# Patient Record
Sex: Male | Born: 1960 | Race: Black or African American | Hispanic: No | Marital: Married | Smoking: Current some day smoker
Health system: Southern US, Community
[De-identification: ages and names within clinical notes are randomized; demographics above are authoritative.]

---

## 1998-03-19 ENCOUNTER — Emergency Department (HOSPITAL_COMMUNITY): Admission: EM | Admit: 1998-03-19 | Discharge: 1998-03-19 | Payer: Self-pay | Admitting: Emergency Medicine

## 2008-01-23 ENCOUNTER — Emergency Department (HOSPITAL_COMMUNITY): Admission: EM | Admit: 2008-01-23 | Discharge: 2008-01-23 | Payer: Self-pay | Admitting: Emergency Medicine

## 2014-01-25 ENCOUNTER — Encounter (HOSPITAL_COMMUNITY): Payer: Self-pay | Admitting: Emergency Medicine

## 2014-01-25 ENCOUNTER — Emergency Department (HOSPITAL_COMMUNITY)
Admission: EM | Admit: 2014-01-25 | Discharge: 2014-01-25 | Disposition: A | Discharge status: Home / Self Care | Payer: Self-pay | Attending: Emergency Medicine | Admitting: Emergency Medicine

## 2014-01-25 ENCOUNTER — Emergency Department (INDEPENDENT_AMBULATORY_CARE_PROVIDER_SITE_OTHER)
Admission: EM | Admit: 2014-01-25 | Discharge: 2014-01-25 | Disposition: A | Discharge status: Nursing Home (Includes ICF) | Payer: Self-pay | Source: Home / Self Care | Attending: Emergency Medicine | Admitting: Emergency Medicine

## 2014-01-25 ENCOUNTER — Emergency Department (HOSPITAL_COMMUNITY): Payer: Self-pay

## 2014-01-25 ENCOUNTER — Emergency Department (INDEPENDENT_AMBULATORY_CARE_PROVIDER_SITE_OTHER): Payer: Self-pay

## 2014-01-25 DIAGNOSIS — I159 Secondary hypertension, unspecified: Secondary | ICD-10-CM

## 2014-01-25 DIAGNOSIS — N200 Calculus of kidney: Secondary | ICD-10-CM

## 2014-01-25 DIAGNOSIS — R1084 Generalized abdominal pain: Secondary | ICD-10-CM

## 2014-01-25 DIAGNOSIS — R319 Hematuria, unspecified: Secondary | ICD-10-CM

## 2014-01-25 DIAGNOSIS — R109 Unspecified abdominal pain: Secondary | ICD-10-CM | POA: Insufficient documentation

## 2014-01-25 DIAGNOSIS — F17200 Nicotine dependence, unspecified, uncomplicated: Secondary | ICD-10-CM | POA: Insufficient documentation

## 2014-01-25 DIAGNOSIS — K5900 Constipation, unspecified: Secondary | ICD-10-CM

## 2014-01-25 DIAGNOSIS — I158 Other secondary hypertension: Secondary | ICD-10-CM | POA: Insufficient documentation

## 2014-01-25 DIAGNOSIS — N19 Unspecified kidney failure: Secondary | ICD-10-CM

## 2014-01-25 DIAGNOSIS — K567 Ileus, unspecified: Secondary | ICD-10-CM

## 2014-01-25 DIAGNOSIS — R112 Nausea with vomiting, unspecified: Secondary | ICD-10-CM

## 2014-01-25 DIAGNOSIS — Z79899 Other long term (current) drug therapy: Secondary | ICD-10-CM | POA: Insufficient documentation

## 2014-01-25 DIAGNOSIS — I10 Essential (primary) hypertension: Secondary | ICD-10-CM

## 2014-01-25 LAB — CBC WITH DIFFERENTIAL/PLATELET
BASOS ABS: 0 10*3/uL (ref 0.0–0.1)
Basophils Relative: 0 % (ref 0–1)
Eosinophils Absolute: 0.2 10*3/uL (ref 0.0–0.7)
Eosinophils Relative: 2 % (ref 0–5)
HEMATOCRIT: 43.6 % (ref 39.0–52.0)
Hemoglobin: 15.2 g/dL (ref 13.0–17.0)
LYMPHS PCT: 24 % (ref 12–46)
Lymphs Abs: 1.8 10*3/uL (ref 0.7–4.0)
MCH: 29.9 pg (ref 26.0–34.0)
MCHC: 34.9 g/dL (ref 30.0–36.0)
MCV: 85.8 fL (ref 78.0–100.0)
MONO ABS: 0.6 10*3/uL (ref 0.1–1.0)
Monocytes Relative: 8 % (ref 3–12)
NEUTROS ABS: 4.7 10*3/uL (ref 1.7–7.7)
Neutrophils Relative %: 66 % (ref 43–77)
Platelets: 212 10*3/uL (ref 150–400)
RBC: 5.08 MIL/uL (ref 4.22–5.81)
RDW: 12.5 % (ref 11.5–15.5)
WBC: 7.2 10*3/uL (ref 4.0–10.5)

## 2014-01-25 LAB — URINALYSIS, ROUTINE W REFLEX MICROSCOPIC
Bilirubin Urine: NEGATIVE
GLUCOSE, UA: NEGATIVE mg/dL
Ketones, ur: 15 mg/dL — AB
NITRITE: NEGATIVE
PH: 6 (ref 5.0–8.0)
Protein, ur: NEGATIVE mg/dL
SPECIFIC GRAVITY, URINE: 1.014 (ref 1.005–1.030)
Urobilinogen, UA: 0.2 mg/dL (ref 0.0–1.0)

## 2014-01-25 LAB — POCT I-STAT, CHEM 8
BUN: 15 mg/dL (ref 6–23)
CHLORIDE: 102 meq/L (ref 96–112)
Calcium, Ion: 1.17 mmol/L (ref 1.12–1.23)
Creatinine, Ser: 2.1 mg/dL — ABNORMAL HIGH (ref 0.50–1.35)
Glucose, Bld: 109 mg/dL — ABNORMAL HIGH (ref 70–99)
HCT: 56 % — ABNORMAL HIGH (ref 39.0–52.0)
Hemoglobin: 19 g/dL — ABNORMAL HIGH (ref 13.0–17.0)
POTASSIUM: 3.9 meq/L (ref 3.7–5.3)
SODIUM: 137 meq/L (ref 137–147)
TCO2: 27 mmol/L (ref 0–100)

## 2014-01-25 LAB — POCT URINALYSIS DIP (DEVICE)
BILIRUBIN URINE: NEGATIVE
GLUCOSE, UA: NEGATIVE mg/dL
LEUKOCYTES UA: NEGATIVE
Nitrite: NEGATIVE
PH: 6.5 (ref 5.0–8.0)
Protein, ur: 30 mg/dL — AB
Specific Gravity, Urine: 1.02 (ref 1.005–1.030)
Urobilinogen, UA: 0.2 mg/dL (ref 0.0–1.0)

## 2014-01-25 LAB — URINE MICROSCOPIC-ADD ON

## 2014-01-25 LAB — OCCULT BLOOD, POC DEVICE: FECAL OCCULT BLD: NEGATIVE

## 2014-01-25 MED ORDER — IOHEXOL 300 MG/ML  SOLN
50.0000 mL | Freq: Once | INTRAMUSCULAR | Status: AC | PRN
  Administered 2014-01-25: 50 mL via ORAL

## 2014-01-25 MED ORDER — HYDROCODONE-ACETAMINOPHEN 5-325 MG PO TABS: 1.0000 | ORAL_TABLET | ORAL | Status: AC | PRN

## 2014-01-25 MED ORDER — SODIUM CHLORIDE 0.9 % IV BOLUS (SEPSIS): 1000.0000 mL | Freq: Once | INTRAVENOUS | Status: DC

## 2014-01-25 MED ORDER — ONDANSETRON HCL 4 MG/2ML IJ SOLN: 4.0000 mg | Freq: Once | INTRAMUSCULAR | Status: DC

## 2014-01-25 NOTE — ED Notes (Signed)
C/o  Severe abdominal cramping on Tuesday after eating some chicken.   Pt states that he has a fullness/bloat.   Nausea.  And unable to have a BM.  Last normal BM was early Tuesday morning.  No relief with otc laxatives .

## 2014-01-25 NOTE — Discharge Instructions (Signed)
Kidney Stones °Kidney stones (urolithiasis) are deposits that form inside your kidneys. The intense pain is caused by the stone moving through the urinary tract. When the stone moves, the ureter goes into spasm around the stone. The stone is usually passed in the urine.  °CAUSES  °· A disorder that makes certain neck glands produce too much parathyroid hormone (primary hyperparathyroidism). °· A buildup of uric acid crystals, similar to gout in your joints. °· Narrowing (stricture) of the ureter. °· A kidney obstruction present at birth (congenital obstruction). °· Previous surgery on the kidney or ureters. °· Numerous kidney infections. °SYMPTOMS  °· Feeling sick to your stomach (nauseous). °· Throwing up (vomiting). °· Blood in the urine (hematuria). °· Pain that usually spreads (radiates) to the groin. °· Frequency or urgency of urination. °DIAGNOSIS  °· Taking a history and physical exam. °· Blood or urine tests. °· CT scan. °· Occasionally, an examination of the inside of the urinary bladder (cystoscopy) is performed. °TREATMENT  °· Observation. °· Increasing your fluid intake. °· Extracorporeal shock wave lithotripsy--This is a noninvasive procedure that uses shock waves to break up kidney stones. °· Surgery may be needed if you have severe pain or persistent obstruction. There are various surgical procedures. Most of the procedures are performed with the use of small instruments. Only small incisions are needed to accommodate these instruments, so recovery time is minimized. °The size, location, and chemical composition are all important variables that will determine the proper choice of action for you. Talk to your health care provider to better understand your situation so that you will minimize the risk of injury to yourself and your kidney.  °HOME CARE INSTRUCTIONS  °· Drink enough water and fluids to keep your urine clear or pale yellow. This will help you to pass the stone or stone fragments. °· Strain  all urine through the provided strainer. Keep all particulate matter and stones for your health care provider to see. The stone causing the pain may be as small as a grain of salt. It is very important to use the strainer each and every time you pass your urine. The collection of your stone will allow your health care provider to analyze it and verify that a stone has actually passed. The stone analysis will often identify what you can do to reduce the incidence of recurrences. °· Only take over-the-counter or prescription medicines for pain, discomfort, or fever as directed by your health care provider. °· Make a follow-up appointment with your health care provider as directed. °· Get follow-up X-rays if required. The absence of pain does not always mean that the stone has passed. It may have only stopped moving. If the urine remains completely obstructed, it can cause loss of kidney function or even complete destruction of the kidney. It is your responsibility to make sure X-rays and follow-ups are completed. Ultrasounds of the kidney can show blockages and the status of the kidney. Ultrasounds are not associated with any radiation and can be performed easily in a matter of minutes. °SEEK MEDICAL CARE IF: °· You experience pain that is progressive and unresponsive to any pain medicine you have been prescribed. °SEEK IMMEDIATE MEDICAL CARE IF:  °· Pain cannot be controlled with the prescribed medicine. °· You have a fever or shaking chills. °· The severity or intensity of pain increases over 18 hours and is not relieved by pain medicine. °· You develop a new onset of abdominal pain. °· You feel faint or pass out. °·   You are unable to urinate. MAKE SURE YOU:   Understand these instructions.  Will watch your condition.  Will get help right away if you are not doing well or get worse. Document Released: 05/25/2005 Document Revised: 01/25/2013 Document Reviewed: 10/26/2012 [facility] Patient Information 2015  [employer] [place]. This information is not intended to replace advice given to you by your health care provider. Make sure you discuss any questions you have with your health care provider.  Hypertension Hypertension, commonly called high blood pressure, is when the force of blood pumping through your arteries is too strong. Your arteries are the blood vessels that carry blood from your heart throughout your body. A blood pressure reading consists of a higher number over a lower number, such as 110/72. The higher number (systolic) is the pressure inside your arteries when your heart pumps. The lower number (diastolic) is the pressure inside your arteries when your heart relaxes. Ideally you want your blood pressure below 120/80. Hypertension forces your heart to work harder to pump blood. Your arteries may become narrow or stiff. Having hypertension puts you at risk for heart disease, stroke, and other problems.  RISK FACTORS Some risk factors for high blood pressure are controllable. Others are not.  Risk factors you cannot control include:   Race. You may be at higher risk if you are African American.  Age. Risk increases with age.  Gender. Men are at higher risk than women before age 55 years. After age 55, women are at higher risk than men. Risk factors you can control include:  Not getting enough exercise or physical activity.  Being overweight.  Getting too much fat, sugar, calories, or salt in your diet.  Drinking too much alcohol. SIGNS AND SYMPTOMS Hypertension does not usually cause signs or symptoms. Extremely high blood pressure (hypertensive crisis) may cause headache, anxiety, shortness of breath, and nosebleed. DIAGNOSIS  To check if you have hypertension, your health care provider will measure your blood pressure while you are seated, with your arm held at the level of your heart. It should be measured at least twice using the same arm. Certain conditions can cause a difference  in blood pressure between your right and left arms. A blood pressure reading that is higher than normal on one occasion does not mean that you need treatment. If one blood pressure reading is high, ask your health care provider about having it checked again. TREATMENT  Treating high blood pressure includes making lifestyle changes and possibly taking medicine. Living a healthy lifestyle can help lower high blood pressure. You may need to change some of your habits. Lifestyle changes may include:  Following the DASH diet. This diet is high in fruits, vegetables, and whole grains. It is low in salt, red meat, and added sugars.  Getting at least 2 hours of brisk physical activity every week.  Losing weight if necessary.  Not smoking.  Limiting alcoholic beverages.  Learning ways to reduce stress. If lifestyle changes are not enough to get your blood pressure under control, your health care provider may prescribe medicine. You may need to take more than one. Work closely with your health care provider to understand the risks and benefits. HOME CARE INSTRUCTIONS  Have your blood pressure rechecked as directed by your health care provider.   Take medicines only as directed by your health care provider. Follow the directions carefully. Blood pressure medicines must be taken as prescribed. The medicine does not work as well when you skip doses. Skipping  doses also puts you at risk for problems.   Do not smoke.   Monitor your blood pressure at home as directed by your health care provider. SEEK MEDICAL CARE IF:   You think you are having a reaction to medicines taken.  You have recurrent headaches or feel dizzy.  You have swelling in your ankles.  You have trouble with your vision. SEEK IMMEDIATE MEDICAL CARE IF:  You develop a severe headache or confusion.  You have unusual weakness, numbness, or feel faint.  You have severe chest or abdominal pain.  You vomit  repeatedly.  You have trouble breathing. MAKE SURE YOU:   Understand these instructions.  Will watch your condition.  Will get help right away if you are not doing well or get worse. Document Released: 05/25/2005 Document Revised: 10/09/2013 Document Reviewed: 03/17/2013 [facility] Patient Information 2015 [place], [place]. This information is not intended to replace advice given to you by your health care provider. Make sure you discuss any questions you have with your health care provider.

## 2014-01-25 NOTE — ED Notes (Addendum)
Dr.Floyd aware of pt's refusing IV

## 2014-01-25 NOTE — ED Provider Notes (Signed)
Chief Complaint   Chief Complaint  Patient presents with  . 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 The pain seemed to begin in the bilateral CVA area and radiated around to the anterior abdomen, covering the entire abdomen and it was rated 5-6/10 at the worst and now is down to 1/10. The patient has not had a bowel movement since this first began. He states he's had no appetite and been unable to eat. He's had difficulty urinating and decreased urine output. The pain was worse after taking a laxative and better if he passes flatus. He denies fever, chills, dysuria, frequency, urgency, or hematuria. He has felt nauseated and vomited once last night. There was no blood in the vomitus or bilious emesis. He denies any history of ulcer disease, gastritis, gallstones, liver problems, kidney stones or kidney infections, diverticulitis or colitis.  Review of Systems   Other than as noted above, the patient denies any of the following symptoms: Constitutional:  No fever, chills, weight loss or anorexia. Abdomen:  No nausea, vomiting, hematememesis, melena, diarrhea, or hematochezia. GU:  No dysuria, frequency, urgency, or hematuria.  No testicular pain or swelling.  PMFSH   Past medical history, family history, social history, meds, and allergies were reviewed. He takes no medications and has not seen a physician in years.  Physical Examination     Vital signs:  BP 188/90  Pulse 74  Temp(Src) 99 F (37.2 C) (Oral)  Resp 16  SpO2 100% Gen:  Alert, oriented, in no distress. Lungs:  Breath sounds clear and equal bilaterally.  No wheezes, rales or rhonchi. Heart:  Regular rhythm.  No gallops or murmers.   Abdomen:  Moderately distended, somewhat tight to palpation, but no tenderness, guarding,  rebound. Bowel sounds are decreased but present, no pulsatile midline abdominal mass or bruit. No organomegaly or mass. He has no hernia, testes are normal. Rectal exam:  No masses, prostate is not enlarged and there no nodules. Prostate is moderately tender to palpation. Stool is heme negative.  Skin:  Clear, warm and dry.  No rash.  Labs   Results for orders placed during the hospital encounter of 01/25/14  CBC WITH DIFFERENTIAL      Result Value Ref Range   WBC 7.2  4.0 - 10.5 K/uL   RBC 5.08  4.22 - 5.81 MIL/uL   Hemoglobin 15.2  13.0 - 17.0 g/dL   HCT 16.143.6  09.639.0 - 04.552.0 %   MCV 85.8  78.0 - 100.0 fL   MCH 29.9  26.0 - 34.0 pg   MCHC 34.9  30.0 - 36.0 g/dL   RDW 40.912.5  81.111.5 - 91.415.5 %   Platelets 212  150 - 400 K/uL   Neutrophils Relative % 66  43 - 77 %   Neutro Abs 4.7  1.7 - 7.7 K/uL   Lymphocytes Relative 24  12 - 46 %   Lymphs Abs 1.8  0.7 - 4.0 K/uL   Monocytes Relative 8  3 - 12 %   Monocytes Absolute 0.6  0.1 - 1.0 K/uL   Eosinophils Relative 2  0 - 5 %   Eosinophils Absolute 0.2  0.0 - 0.7 K/uL   Basophils Relative 0  0 - 1 %   Basophils Absolute 0.0  0.0 -  0.1 K/uL  OCCULT BLOOD, POC DEVICE      Result Value Ref Range   Fecal Occult Bld NEGATIVE  NEGATIVE  POCT URINALYSIS DIP (DEVICE)      Result Value Ref Range   Glucose, UA NEGATIVE  NEGATIVE mg/dL   Bilirubin Urine NEGATIVE  NEGATIVE   Ketones, ur TRACE (*) NEGATIVE mg/dL   Specific Gravity, Urine 1.020  1.005 - 1.030   Hgb urine dipstick LARGE (*) NEGATIVE   pH 6.5  5.0 - 8.0   Protein, ur 30 (*) NEGATIVE mg/dL   Urobilinogen, UA 0.2  0.0 - 1.0 mg/dL   Nitrite NEGATIVE  NEGATIVE   Leukocytes, UA NEGATIVE  NEGATIVE  POCT I-STAT, CHEM 8      Result Value Ref Range   Sodium 137  137 - 147 mEq/L   Potassium 3.9  3.7 - 5.3 mEq/L   Chloride 102  96 - 112 mEq/L   BUN 15  6 - 23 mg/dL   Creatinine, Ser 1.61 (*) 0.50 - 1.35 mg/dL   Glucose, Bld 096 (*) 70 - 99 mg/dL   Calcium, Ion 0.45  4.09 - 1.23 mmol/L    TCO2 27  0 - 100 mmol/L   Hemoglobin 19.0 (*) 13.0 - 17.0 g/dL   HCT 81.1 (*) 91.4 - 78.2 %     Radiology   Dg Abd Acute W/chest  01/25/2014   CLINICAL DATA:  Two-day history of abdominal discomfort ; history of tobacco use  EXAM: ACUTE ABDOMEN SERIES (ABDOMEN 2 VIEW & CHEST 1 VIEW)  COMPARISON:  None.  FINDINGS: The lungs are adequately inflated. The interstitial markings are coarse inferiorly, bilaterally. The cardiopericardial silhouette is top-normal in size. The pulmonary vascularity is not engorged. The mediastinum is normal in width. There is no pleural effusion or pneumothorax. The bony thorax exhibits no acute abnormality.  Within the abdomen there is a moderate amount of gas throughout the ascending and transverse portions of the colon with smaller amounts in the small bowel. There is no rectal gas. No free extraluminal gas collections are demonstrated. There are small bowel air-fluid levels on the upright film. There are soft tissue calcifications or radiodensities that may be related to previous barium administration. The bony structures are unremarkable.  IMPRESSION: 1. The bowel gas pattern suggests an ileus or gastroenteritis type process. If the patient's symptoms warrant further imaging now, abdominal pelvic CT scanning may be useful. 2. Coarse interstitial lung markings bilaterally are consistent with the patient's smoking history with possible superimposed subsegmental atelectasis or fibrosis.   Electronically Signed   By: Tabria Steines  [place]   On: 01/25/2014 13:56   Assessment   The primary encounter diagnosis was Generalized abdominal pain. Diagnoses of Constipation, unspecified constipation type, Ileus, Essential hypertension, Renal failure, Non-intractable vomiting with nausea, vomiting of unspecified type, and Hematuria were also pertinent to this visit.  My concern is for abdominal aortic aneurysm, ileus, partial small bowel obstruction, pancreatitis, diverticulitis, kidney stone or  infection, prostatitis, acute or chronic kidney failure, or gastroenteritis. I feel he needs further workup to include a CT of the abdomen or ultrasound. He'll also need followup with a primary care physician for his hypertension and increased creatinine. I have given him the name of Dr. Rocco Pauls Bonzu who is his wife's doctor.  Plan     The patient was transferred to the ED via shuttle in stable condition.  Medical Decision Making:  The patient is a 53 year old male with a 3 day history  of generalized abdominal pain that radiates from his back, around his flanks and to his anterior abdomen.  He also has constipation, difficulty urinating, nausea and vomiting. On exam his abdomen is distended and bowel sounds are diminished.  His prostate is moderately tender on rectal exam with heme negative stools.  His acute abdominal series shows ileus.  Creatinine is 2.1 (no baseline value).  CBC is normal.  UA shows large occult blood.  My concerns are for: abdominal aortic aneurism, ileus, partial small bowel obstruction, kidney stones, or acute renal failure.  I feel he needs further evaluation, possibly a CT or ultrasound.       Reuben Likes, MD 01/25/14 [phone]

## 2014-01-25 NOTE — Discharge Instructions (Signed)
We have determined that your problem requires further evaluation in the emergency department.  We will take care of your transport there.  Once at the emergency department, you will be evaluated by a provider and they will order whatever treatment or tests they deem necessary.  We cannot guarantee that they will do any specific test or do any specific treatment.  ° °

## 2014-01-25 NOTE — ED Notes (Addendum)
[facility] c/o lower abdominal pain, n/v, last BM was last week. Sent here for CT, concern for ileus. Needs CT. Pt is a x 4. [facility] No active [facility].

## 2014-01-25 NOTE — ED Notes (Signed)
Dr. Floyd at bedside. 

## 2014-01-25 NOTE — ED Notes (Signed)
Pt refusing IV; sts "I really do not want more blood taken from me." Informed pt of IV fluids and medications. Pt refusing

## 2014-01-25 NOTE — ED Notes (Signed)
Pt fully dressed and ready to be discharge. Informed MD of patient status

## 2014-01-25 NOTE — ED Provider Notes (Signed)
CSN: [id number]     Arrival date & time 01/25/14  1514 History   First MD Initiated Contact with Patient 01/25/14 1650     Chief Complaint  Patient presents with  . Abdominal Pain     (Consider location/radiation/quality/duration/timing/severity/associated sxs/prior Treatment) Patient is a 53 y.o. male presenting with flank pain. The history is provided by the patient.  Flank Pain This is a new problem. The current episode started in the past 7 days. The problem occurs constantly. The problem has been unchanged. Pertinent negatives include no abdominal pain, arthralgias, chest pain, chills, congestion, fever, headaches, myalgias, rash or vomiting. Nothing aggravates the symptoms. He has tried nothing for the symptoms. The treatment provided no relief.    53 yo M with a chief complaint of abdominal pain. Patient states it was in bilateral flanks it was never very severe. Patient states it has been somewhat diffuse as well. Patient with some nausea and vomiting with this denies bilious or bloody emesis. Patient denies fevers or chills. Patient has been passing gas frequently today. Patient has been constipated for the past couple days.  No history of abdominal surgery. Patient does not see a doctor regularly. Patient does not know if he has any kidney problems. History reviewed. No pertinent past medical history. History reviewed. No pertinent past surgical history. No family history on file. History  Substance Use Topics  . Smoking status: Current Some Day Smoker -- 0.50 packs/day    Types: Cigarettes  . Smokeless tobacco: Not on file  . Alcohol Use: Yes    Review of Systems  Constitutional: Negative for fever and chills.  HENT: Negative for congestion and facial swelling.   Eyes: Negative for discharge and visual disturbance.  Respiratory: Negative for shortness of breath.   Cardiovascular: Negative for chest pain and palpitations.  Gastrointestinal: Negative for vomiting,  abdominal pain and diarrhea.  Genitourinary: Positive for flank pain.  Musculoskeletal: Negative for arthralgias and myalgias.  Skin: Negative for color change and rash.  Neurological: Negative for tremors, syncope and headaches.  Psychiatric/Behavioral: Negative for confusion and dysphoric mood.      Allergies  Review of patient's allergies indicates no known allergies.  Home Medications   Prior to Admission medications   Medication Sig Start Date End Date Taking? Authorizing Provider  HYDROcodone-acetaminophen (NORCO/VICODIN) 5-325 MG per tablet Take 1 tablet by mouth every 4 (four) hours as needed for moderate pain or severe pain. 01/25/14   Melene Plan, MD   BP 160/95  Pulse 66  Temp(Src) 98.8 F (37.1 C) (Oral)  Resp 18  Ht 6' (1.829 m)  Wt 247 lb (112.038 kg)  BMI 33.49 kg/m2  SpO2 96% Physical Exam  Constitutional: He is oriented to person, place, and time. He appears well-developed and well-nourished.  HENT:  Head: Normocephalic and atraumatic.  Eyes: EOM are normal. Pupils are equal, round, and reactive to light.  Neck: Normal range of motion. Neck supple. No JVD present.  Cardiovascular: Normal rate and regular rhythm.  Exam reveals no gallop and no friction rub.   No murmur heard. Pulmonary/Chest: No respiratory distress. He has no wheezes.  Abdominal: He exhibits no distension. There is no rebound and no guarding.  Musculoskeletal: Normal range of motion.  Neurological: He is alert and oriented to person, place, and time.  Skin: No rash noted. No pallor.  Psychiatric: He has a normal mood and affect. His behavior is normal.    ED Course  Procedures (including critical care time) Labs Review Labs  Reviewed  URINALYSIS, ROUTINE W REFLEX MICROSCOPIC - Abnormal; Notable for the following:    APPearance HAZY (*)    Hgb urine dipstick LARGE (*)    Ketones, ur 15 (*)    Leukocytes, UA TRACE (*)    All other components within normal limits  URINE MICROSCOPIC-ADD  ON  COMPREHENSIVE METABOLIC PANEL  LIPASE, BLOOD    Imaging Review Ct Abdomen Pelvis Wo Contrast  01/25/2014   CLINICAL DATA:  Abdominal pain.  EXAM: CT ABDOMEN AND PELVIS WITHOUT CONTRAST  TECHNIQUE: Multidetector CT imaging of the abdomen and pelvis was performed following the standard protocol without IV contrast.  COMPARISON:  None.  FINDINGS: Lung bases are clear. There is no evidence for free intraperitoneal air.  No gross abnormality to the liver, gallbladder, spleen or pancreas on this noncontrast examination. Normal appearance of the adrenal glands. Normal appearance of the stomach and duodenum.  There are 2 stones in the right kidney upper pole and the largest measures 4 mm. There is no evidence for right hydronephrosis. There is a 4 mm stone along the posterior aspect of the bladder but near the left ureterovesical junction. There is mild edema around the left renal pelvis and the left ureter. Mild dilatation of the left ureter. There is mild left hydronephrosis. Question a punctate stone in the left kidney mid pole. Urinary bladder is markedly distended.  No significant free fluid or lymphadenopathy. Normal appearance of the prostate with a few calcifications. There is contrast in the colon and appendix. No evidence for acute bowel inflammation. Normal appearance of the small bowel.  No acute bone abnormality.  IMPRESSION: Mild left hydroureteronephrosis with edema surrounding the left ureter. There is a 4 mm stone in the posterior bladder. This could represent a stone at the left ureterovesical junction or within the bladder lumen.  Right renal calculi.  Question a punctate left kidney stone.  Urinary bladder is distended.   Electronically Signed   By: Richarda OverlieAdam  Henn M.D.   On: 01/25/2014 20:33   Dg Abd Acute W/chest  01/25/2014   CLINICAL DATA:  Two-day history of abdominal discomfort ; history of tobacco use  EXAM: ACUTE ABDOMEN SERIES (ABDOMEN 2 VIEW & CHEST 1 VIEW)  COMPARISON:  None.   FINDINGS: The lungs are adequately inflated. The interstitial markings are coarse inferiorly, bilaterally. The cardiopericardial silhouette is top-normal in size. The pulmonary vascularity is not engorged. The mediastinum is normal in width. There is no pleural effusion or pneumothorax. The bony thorax exhibits no acute abnormality.  Within the abdomen there is a moderate amount of gas throughout the ascending and transverse portions of the colon with smaller amounts in the small bowel. There is no rectal gas. No free extraluminal gas collections are demonstrated. There are small bowel air-fluid levels on the upright film. There are soft tissue calcifications or radiodensities that may be related to previous barium administration. The bony structures are unremarkable.  IMPRESSION: 1. The bowel gas pattern suggests an ileus or gastroenteritis type process. If the patient's symptoms warrant further imaging now, abdominal pelvic CT scanning may be useful. 2. Coarse interstitial lung markings bilaterally are consistent with the patient's smoking history with possible superimposed subsegmental atelectasis or fibrosis.   Electronically Signed   By: David  [place]   On: 01/25/2014 13:56     EKG Interpretation None      MDM   Final diagnoses:  Nephrolithiasis  Secondary hypertension, unspecified    53 yo M with the chief complaint of diffuse  abdominal pain. Abdomen tympanitic and distended. Will obtain noncontrast CT scan secondary to possible chronic kidney disease.  Patient found to have bilateral kidney stones on CT scan. Left kidney stone 4 mm in the distal ureter with associated hydronephrosis and hydroureter. Patient without UTI on UA. Possible acute kidney injury based on labs from urgent care. Patient declines repeat laboratory results.  Discussed concern with patient possible acute kidney injury associated with bilateral nephrolithiasis. Patient states that he understands the concern but would  like to be discharged home. We'll discharge the patient home he'll follow up with urology and the [facility].  9:09 PM:  I have discussed the diagnosis/risks/treatment options with the patient and family and believe the pt to be eligible for discharge home to follow-up with PCP, urology. We also discussed returning to the ED immediately if new or worsening sx occur. We discussed the sx which are most concerning (e.g., dysguesia, weakness, fever, chills) that necessitate immediate return. Medications administered to the patient during their visit and any new prescriptions provided to the patient are listed below.  Medications given during this visit Medications  sodium chloride 0.9 % bolus 1,000 mL (1,000 mLs Intravenous Not Given 01/25/14 1723)  ondansetron (ZOFRAN) injection 4 mg (4 mg Intravenous Not Given 01/25/14 1723)  iohexol (OMNIPAQUE) 300 MG/ML solution 50 mL (50 mLs Oral Contrast Given 01/25/14 1714)    New Prescriptions   HYDROCODONE-ACETAMINOPHEN (NORCO/VICODIN) 5-325 MG PER TABLET    Take 1 tablet by mouth every 4 (four) hours as needed for moderate pain or severe pain.     Melene Plan, MD 01/25/14 2109

## 2014-01-25 NOTE — ED Provider Notes (Signed)
I saw and evaluated the patient, reviewed the resident's note and I agree with the findings and plan.   EKG Interpretation None     Ct Abdomen Pelvis Wo Contrast  01/25/2014   CLINICAL DATA:  Abdominal pain.  EXAM: CT ABDOMEN AND PELVIS WITHOUT CONTRAST  TECHNIQUE: Multidetector CT imaging of the abdomen and pelvis was performed following the standard protocol without IV contrast.  COMPARISON:  None.  FINDINGS: Lung bases are clear. There is no evidence for free intraperitoneal air.  No gross abnormality to the liver, gallbladder, spleen or pancreas on this noncontrast examination. Normal appearance of the adrenal glands. Normal appearance of the stomach and duodenum.  There are 2 stones in the right kidney upper pole and the largest measures 4 mm. There is no evidence for right hydronephrosis. There is a 4 mm stone along the posterior aspect of the bladder but near the left ureterovesical junction. There is mild edema around the left renal pelvis and the left ureter. Mild dilatation of the left ureter. There is mild left hydronephrosis. Question a punctate stone in the left kidney mid pole. Urinary bladder is markedly distended.  No significant free fluid or lymphadenopathy. Normal appearance of the prostate with a few calcifications. There is contrast in the colon and appendix. No evidence for acute bowel inflammation. Normal appearance of the small bowel.  No acute bone abnormality.  IMPRESSION: Mild left hydroureteronephrosis with edema surrounding the left ureter. There is a 4 mm stone in the posterior bladder. This could represent a stone at the left ureterovesical junction or within the bladder lumen.  Right renal calculi.  Question a punctate left kidney stone.  Urinary bladder is distended.   Electronically Signed   By: Richarda OverlieAdam  Henn M.D.   On: 01/25/2014 20:33   Dg Abd Acute W/chest  01/25/2014   CLINICAL DATA:  Two-day history of abdominal discomfort ; history of tobacco use  EXAM: ACUTE ABDOMEN  SERIES (ABDOMEN 2 VIEW & CHEST 1 VIEW)  COMPARISON:  None.  FINDINGS: The lungs are adequately inflated. The interstitial markings are coarse inferiorly, bilaterally. The cardiopericardial silhouette is top-normal in size. The pulmonary vascularity is not engorged. The mediastinum is normal in width. There is no pleural effusion or pneumothorax. The bony thorax exhibits no acute abnormality.  Within the abdomen there is a moderate amount of gas throughout the ascending and transverse portions of the colon with smaller amounts in the small bowel. There is no rectal gas. No free extraluminal gas collections are demonstrated. There are small bowel air-fluid levels on the upright film. There are soft tissue calcifications or radiodensities that may be related to previous barium administration. The bony structures are unremarkable.  IMPRESSION: 1. The bowel gas pattern suggests an ileus or gastroenteritis type process. If the patient's symptoms warrant further imaging now, abdominal pelvic CT scanning may be useful. 2. Coarse interstitial lung markings bilaterally are consistent with the patient's smoking history with possible superimposed subsegmental atelectasis or fibrosis.   Electronically Signed   By: David  [place]   On: 01/25/2014 13:56   Patient with what appears to be a left distal ureteral stone with mild left hydro-.  His creatinine is 2 and no prior baseline is obtainable at this time.  Patient would prefer to go home and followup with his primary care physician.  Home with a short course of pain medicine and urology followup.  He understands to return to the ER for new or worsening symptoms.  Results for orders placed  during the hospital encounter of 01/25/14  URINALYSIS, ROUTINE W REFLEX MICROSCOPIC      Result Value Ref Range   Color, Urine YELLOW  YELLOW   APPearance HAZY (*) CLEAR   Specific Gravity, Urine 1.014  1.005 - 1.030   pH 6.0  5.0 - 8.0   Glucose, UA NEGATIVE  NEGATIVE mg/dL   Hgb  urine dipstick LARGE (*) NEGATIVE   Bilirubin Urine NEGATIVE  NEGATIVE   Ketones, ur 15 (*) NEGATIVE mg/dL   Protein, ur NEGATIVE  NEGATIVE mg/dL   Urobilinogen, UA 0.2  0.0 - 1.0 mg/dL   Nitrite NEGATIVE  NEGATIVE   Leukocytes, UA TRACE (*) NEGATIVE  URINE MICROSCOPIC-ADD ON      Result Value Ref Range   Squamous Epithelial / LPF RARE  RARE   WBC, UA 3-6  <3 WBC/hpf   RBC / HPF 11-20  <3 RBC/hpf   Bacteria, UA RARE  RARE   Urine-Other MUCOUS PRESENT       Lyanne Co, MD 01/26/14 0003

## 2015-05-30 IMAGING — CR DG ABDOMEN ACUTE W/ 1V CHEST
3 series · 3 of 3 positions shown · non-contrast
Comparison: None.

CLINICAL DATA: Two-day history of abdominal discomfort ; history of
tobacco use

EXAM:
ACUTE ABDOMEN SERIES (ABDOMEN 2 VIEW & CHEST 1 VIEW)

[view not recorded (1 of 3)]
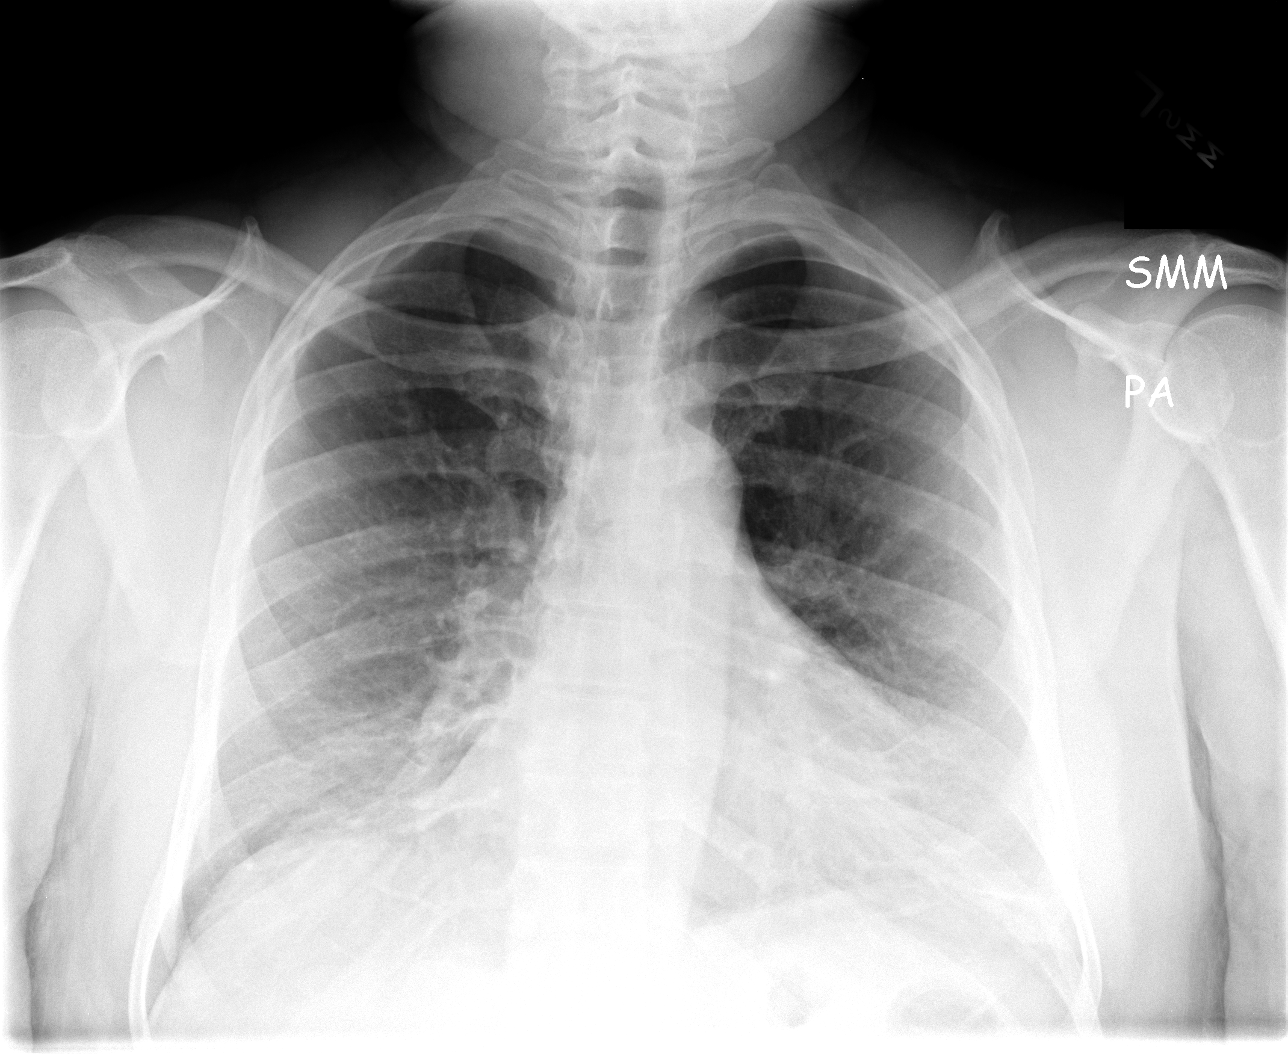

[view not recorded (2 of 3)]
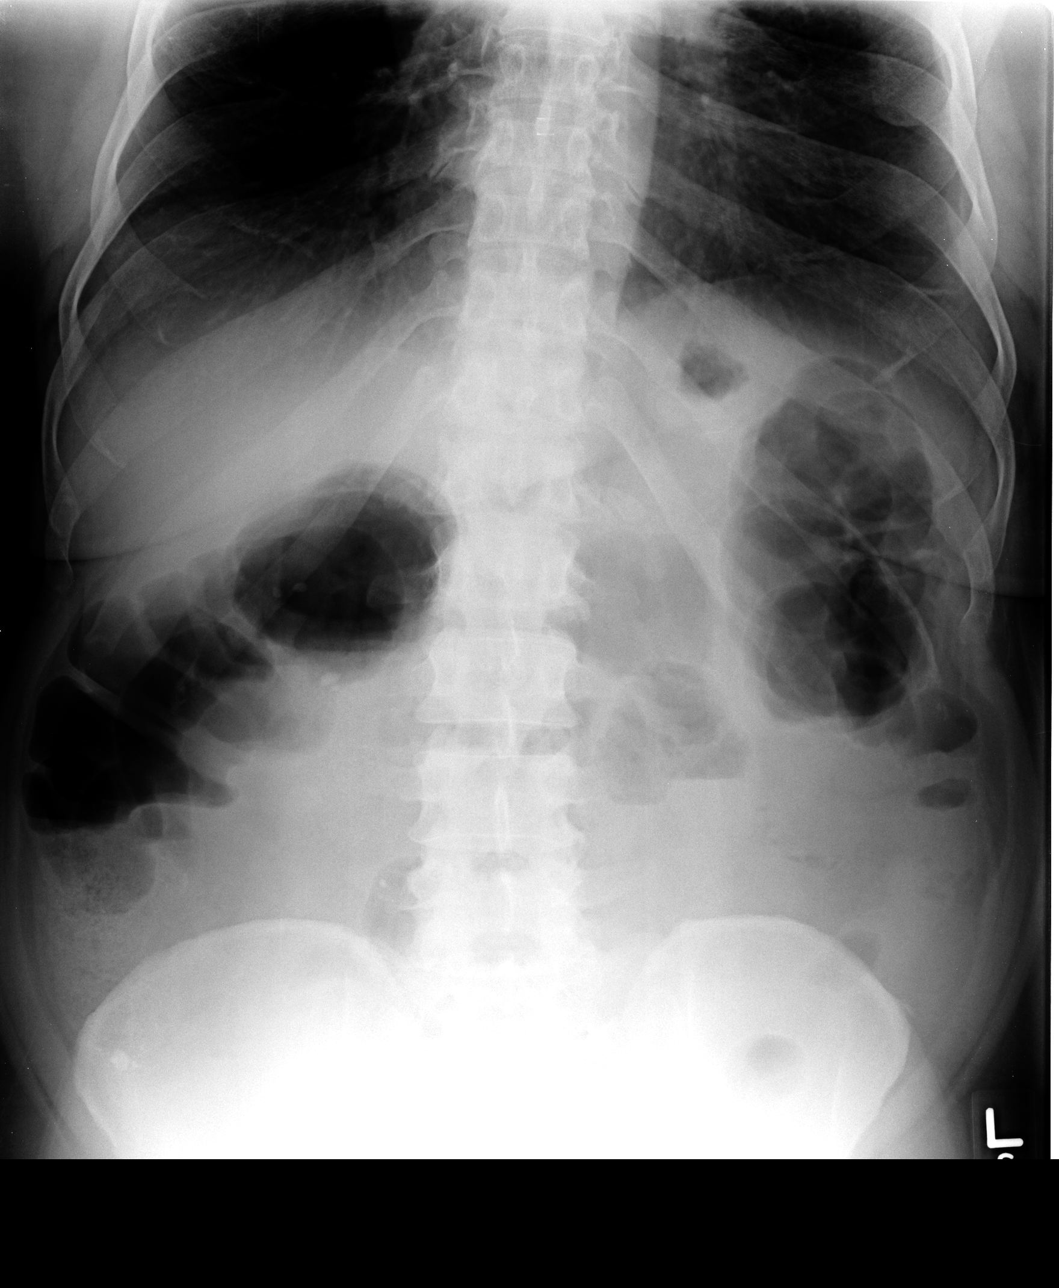

[view not recorded (3 of 3)]
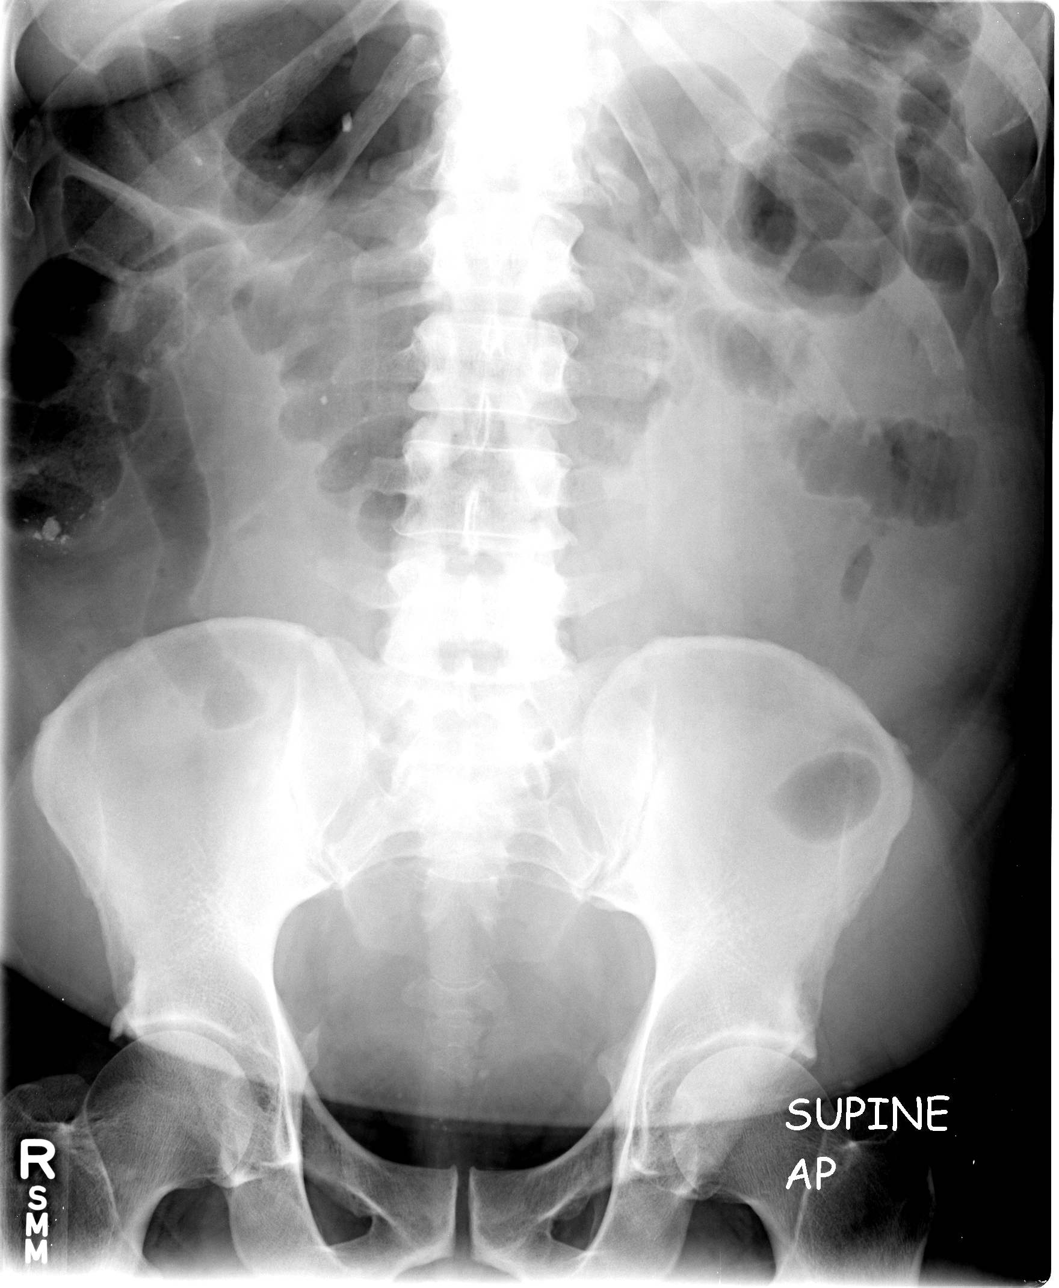

[3 of 3 positions shown; findings below may reference images not displayed]

FINDINGS: The lungs are adequately inflated. The interstitial markings are
coarse inferiorly, bilaterally. The cardiopericardial silhouette is
top-normal in size. The pulmonary vascularity is not engorged. The
mediastinum is normal in width. There is no pleural effusion or
pneumothorax. The bony thorax exhibits no acute abnormality.

Within the abdomen there is a moderate amount of gas throughout the
ascending and transverse portions of the colon with smaller amounts
in the small bowel. There is no rectal gas. No free extraluminal gas
collections are demonstrated. There are small bowel air-fluid levels
on the upright film. There are soft tissue calcifications or
radiodensities that may be related to previous barium
administration. The bony structures are unremarkable.
IMPRESSION: 1. The bowel gas pattern suggests an ileus or gastroenteritis type
process. If the patient's symptoms warrant further imaging now,
abdominal pelvic CT scanning may be useful.
2. Coarse interstitial lung markings bilaterally are consistent with
the patient's smoking history with possible superimposed
subsegmental atelectasis or fibrosis.

## 2015-05-30 IMAGING — CT CT ABD-PELV W/O CM
2 of 4 series · 16 of 46 positions shown, 18 images · non-contrast
Comparison: None.

CLINICAL DATA: Abdominal pain.

EXAM:
CT ABDOMEN AND PELVIS WITHOUT CONTRAST
TECHNIQUE: Multidetector CT imaging of the abdomen and pelvis was performed
following the standard protocol without IV contrast.

[Series 2: abd/ pelvis 5.0 i30f 1 · axial · 0.71mm/px · z∈[+879,+1329]mm · 13 of 98 slices shown, 15 images]
[im 4/98  soft-tissue]
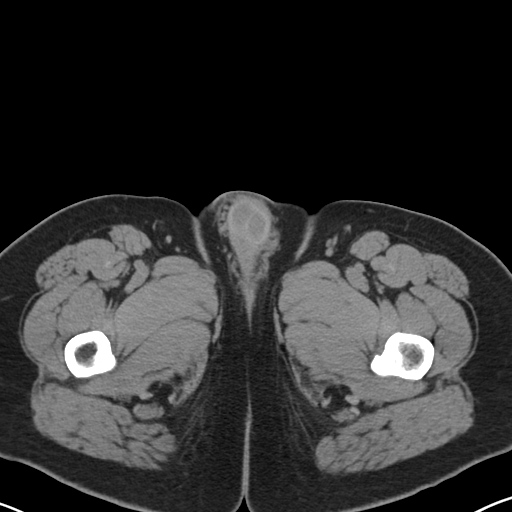
[im 4/98  bone]
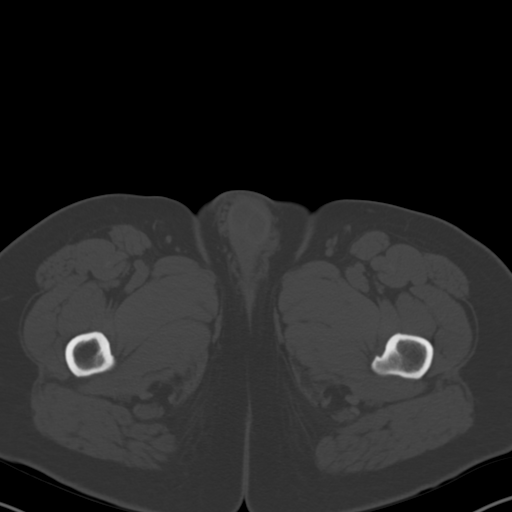
[im 12/98  soft-tissue]
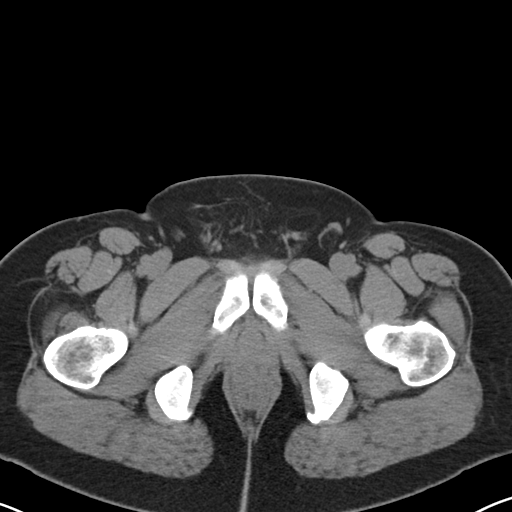
[im 20/98  soft-tissue]
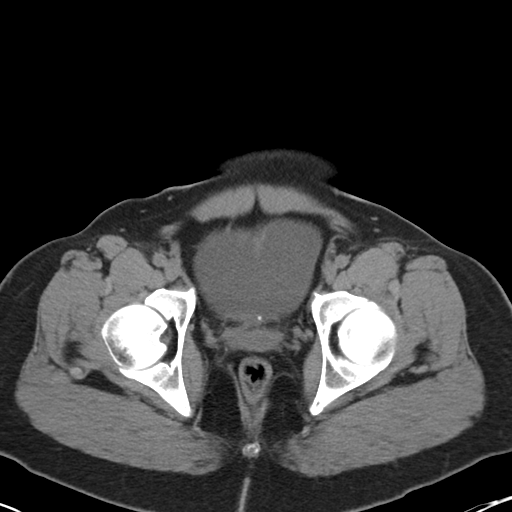
[im 28/98  soft-tissue]
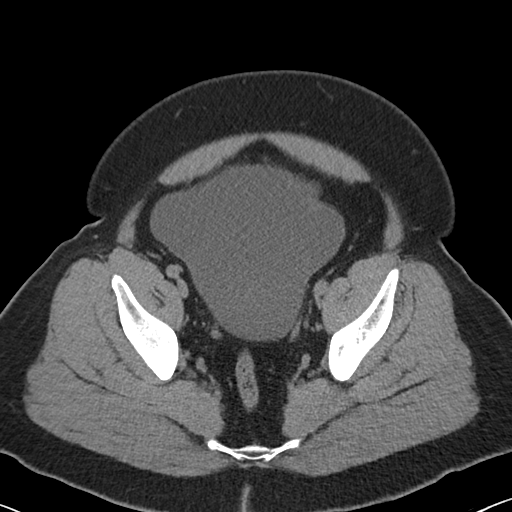
[im 35/98  soft-tissue]
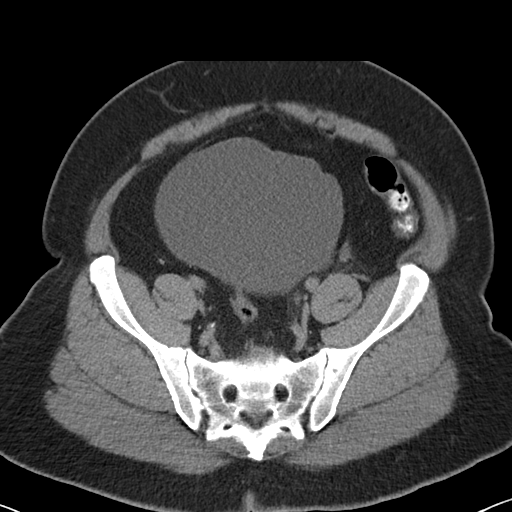
[im 43/98  soft-tissue]
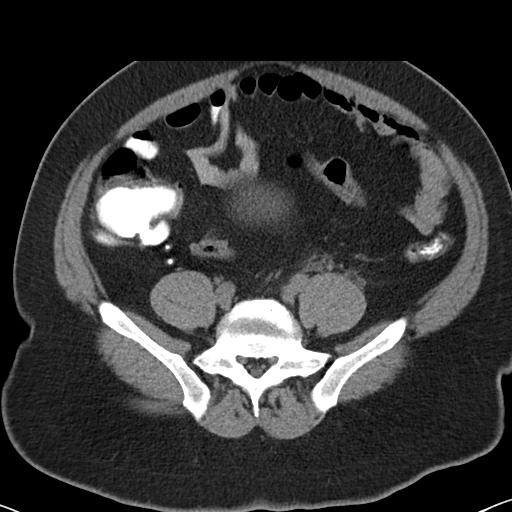
[im 51/98  soft-tissue]
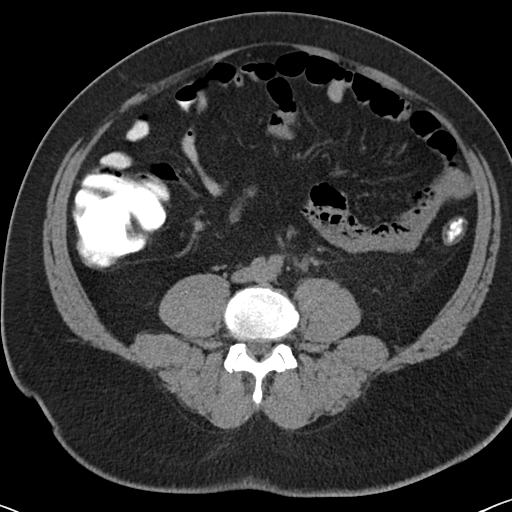
[im 55/98  soft-tissue]
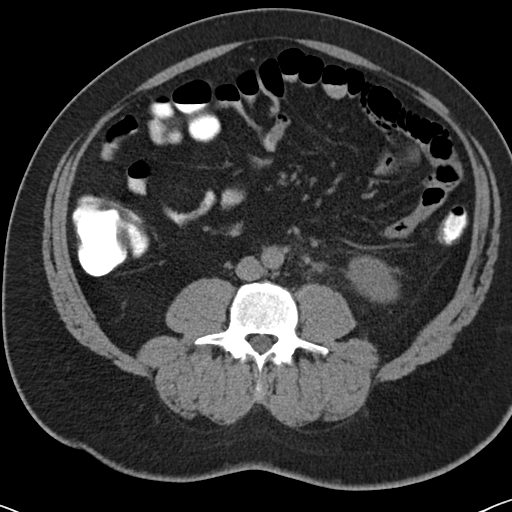
[im 63/98  soft-tissue]
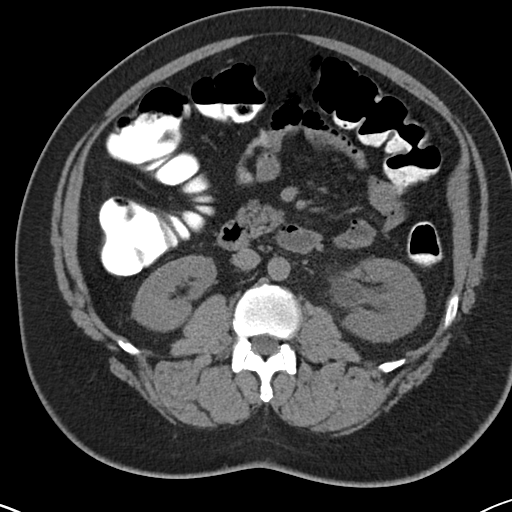
[im 63/98  bone]
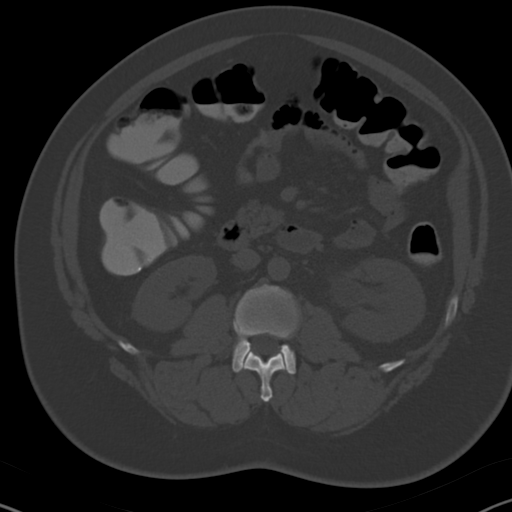
[im 70/98  soft-tissue]
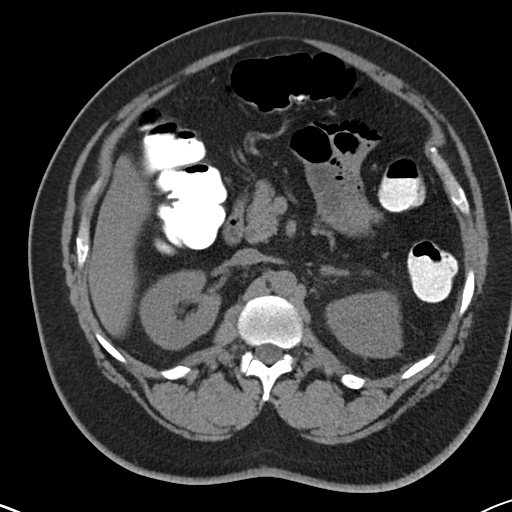
[im 78/98  soft-tissue]
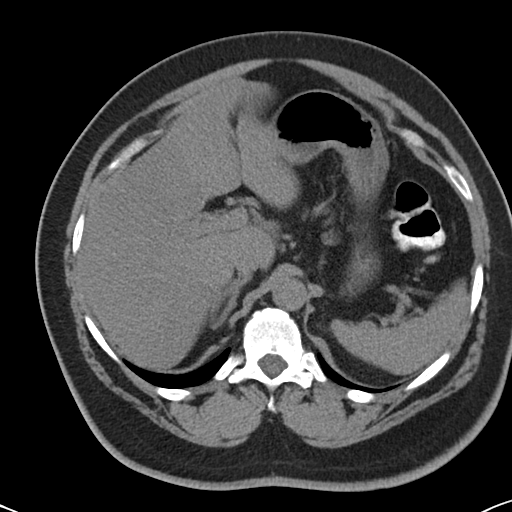
[im 86/98  soft-tissue]
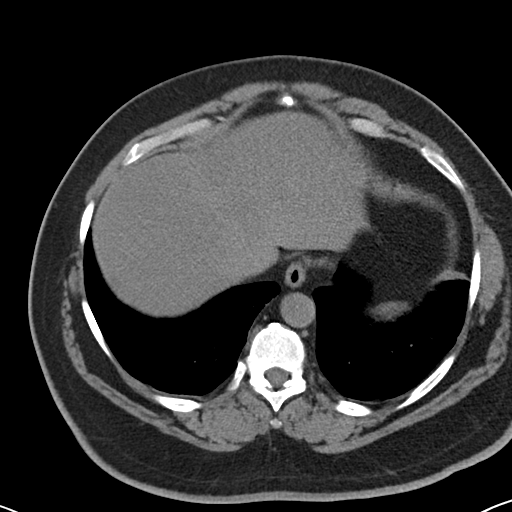
[im 94/98  soft-tissue]
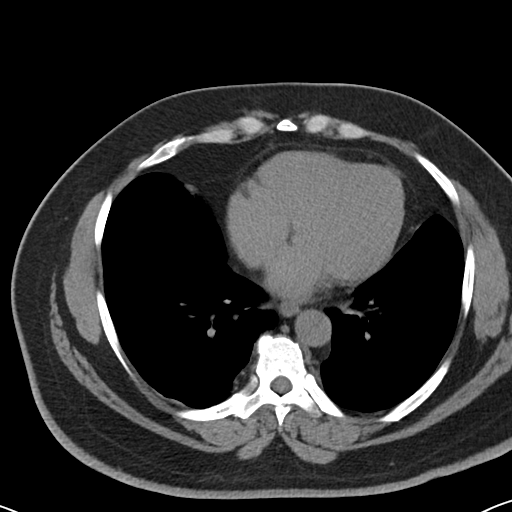

[Series 5: coronals · coronal · 0.70mm/px · 3 of 165 slices shown]
[im 55/165  soft-tissue]
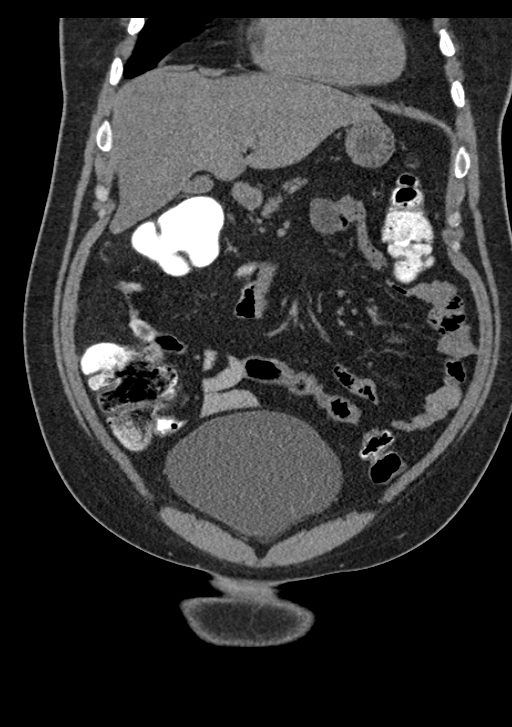
[im 73/165  soft-tissue]
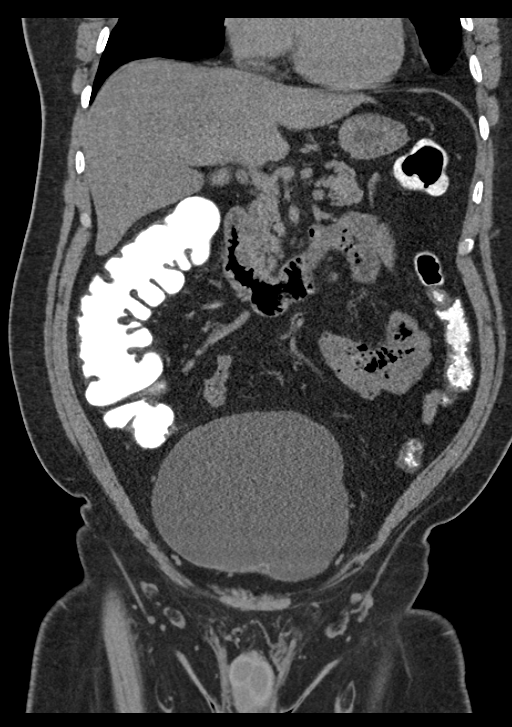
[im 92/165  soft-tissue]
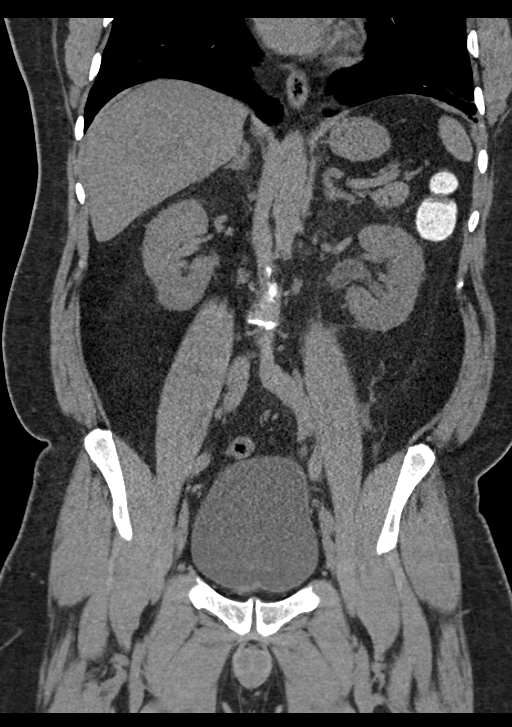

[16 of 46 positions shown; findings below may reference images not displayed]

FINDINGS: Lung bases are clear. There is no evidence for free intraperitoneal
air.

No gross abnormality to the liver, gallbladder, spleen or pancreas
on this noncontrast examination. Normal appearance of the adrenal
glands. Normal appearance of the stomach and duodenum.

There are 2 stones in the right kidney upper pole and the largest
measures 4 mm. There is no evidence for right hydronephrosis. There
is a 4 mm stone along the posterior aspect of the bladder but near
the left ureterovesical junction. There is mild edema around the
left renal pelvis and the left ureter. Mild dilatation of the left
ureter. There is mild left hydronephrosis. Question a punctate stone
in the left kidney mid pole. Urinary bladder is markedly distended.

No significant free fluid or lymphadenopathy. Normal appearance of
the prostate with a few calcifications. There is contrast in the
colon and appendix. No evidence for acute bowel inflammation. Normal
appearance of the small bowel.

No acute bone abnormality.
IMPRESSION: Mild left hydroureteronephrosis with edema surrounding the left
ureter. There is a 4 mm stone in the posterior bladder. This could
represent a stone at the left ureterovesical junction or within the
bladder lumen.

Right renal calculi.  Question a punctate left kidney stone.

Urinary bladder is distended.
